# Patient Record
Sex: Male | Born: 1990 | Race: White | Hispanic: No | Marital: Married | Smoking: Never smoker
Health system: Southern US, Community
[De-identification: ages and names within clinical notes are randomized; demographics above are authoritative.]

---

## 2018-07-14 ENCOUNTER — Emergency Department (HOSPITAL_COMMUNITY)
Admission: EM | Admit: 2018-07-14 | Discharge: 2018-07-14 | Disposition: A | Discharge status: Home / Self Care | Payer: 59 | Attending: Emergency Medicine | Admitting: Emergency Medicine

## 2018-07-14 ENCOUNTER — Emergency Department (HOSPITAL_COMMUNITY): Payer: 59

## 2018-07-14 ENCOUNTER — Encounter (HOSPITAL_COMMUNITY): Payer: Self-pay | Admitting: *Deleted

## 2018-07-14 DIAGNOSIS — Y939 Activity, unspecified: Secondary | ICD-10-CM | POA: Insufficient documentation

## 2018-07-14 DIAGNOSIS — S2242XA Multiple fractures of ribs, left side, initial encounter for closed fracture: Secondary | ICD-10-CM

## 2018-07-14 DIAGNOSIS — Y92511 Restaurant or cafe as the place of occurrence of the external cause: Secondary | ICD-10-CM | POA: Insufficient documentation

## 2018-07-14 DIAGNOSIS — X500XXA Overexertion from strenuous movement or load, initial encounter: Secondary | ICD-10-CM | POA: Insufficient documentation

## 2018-07-14 DIAGNOSIS — Y999 Unspecified external cause status: Secondary | ICD-10-CM | POA: Insufficient documentation

## 2018-07-14 DIAGNOSIS — S299XXA Unspecified injury of thorax, initial encounter: Secondary | ICD-10-CM | POA: Diagnosis present

## 2018-07-14 MED ORDER — METHOCARBAMOL 500 MG PO TABS: 500.0000 mg | ORAL_TABLET | Freq: Three times a day (TID) | ORAL | 0 refills | Status: AC | PRN

## 2018-07-14 MED ORDER — LIDOCAINE 5 % EX PTCH
1.0000 | MEDICATED_PATCH | CUTANEOUS | Status: DC
  Administered 2018-07-14: 1 via TRANSDERMAL
  Filled 2018-07-14: qty 1

## 2018-07-14 MED ORDER — METHOCARBAMOL 500 MG PO TABS
1000.0000 mg | ORAL_TABLET | Freq: Once | ORAL | Status: AC
  Administered 2018-07-14: 1000 mg via ORAL
  Filled 2018-07-14: qty 2

## 2018-07-14 MED ORDER — LIDOCAINE 5 % EX PTCH: 1.0000 | MEDICATED_PATCH | CUTANEOUS | 0 refills | Status: AC

## 2018-07-14 MED ORDER — NAPROXEN 500 MG PO TABS: 500.0000 mg | ORAL_TABLET | Freq: Two times a day (BID) | ORAL | 0 refills | Status: AC

## 2018-07-14 NOTE — ED Notes (Signed)
Patient given discharge instructions and verbalized understanding.  Patient stable to discharge at this time.  Patient is alert and oriented to baseline.  No distressed noted at this time.  All belongings taken with the patient at discharge.   
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
## 2018-07-14 NOTE — Discharge Instructions (Addendum)
You were evaluated in the Emergency Department and after careful evaluation, we did not find any emergent condition requiring admission or further testing in the hospital.  Your symptoms today seem to be due to multiple left-sided broken ribs.  This injury most likely occurred 1 to 2 weeks ago, and was flared up today.  Is very important to use the incentive spirometry device at home and take deep breaths throughout the day.  Use the pain medications provided as directed to control your pain so that you are able to take deep breaths.  If you experience shortness of breath, fever, cough, dizziness or fainting spells, please return to the emergency department.  Please return to the Emergency Department if you experience any worsening of your condition.  We encourage you to follow up with a primary care provider.  Thank you for allowing [place] to be a part of your care.
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
